# Patient Record
Sex: Male | Born: 1990 | Race: White | Hispanic: No | Marital: Single | Smoking: Current every day smoker
Health system: Southern US, Community
[De-identification: ages and names within clinical notes are randomized; demographics above are authoritative.]

## PROBLEM LIST (undated history)

## (undated) DIAGNOSIS — M5030 Other cervical disc degeneration, unspecified cervical region: Secondary | ICD-10-CM

## (undated) DIAGNOSIS — S0291XA Unspecified fracture of skull, initial encounter for closed fracture: Secondary | ICD-10-CM

## (undated) DIAGNOSIS — J942 Hemothorax: Secondary | ICD-10-CM

---
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
## 2011-09-07 ENCOUNTER — Emergency Department (HOSPITAL_COMMUNITY)
Admission: EM | Admit: 2011-09-07 | Discharge: 2011-09-08 | Disposition: A | Discharge status: Home / Self Care | Payer: Self-pay | Attending: Emergency Medicine | Admitting: Emergency Medicine

## 2011-09-07 ENCOUNTER — Emergency Department (HOSPITAL_COMMUNITY): Payer: Self-pay

## 2011-09-07 ENCOUNTER — Encounter (HOSPITAL_COMMUNITY): Payer: Self-pay | Admitting: *Deleted

## 2011-09-07 DIAGNOSIS — R51 Headache: Secondary | ICD-10-CM | POA: Insufficient documentation

## 2011-09-07 DIAGNOSIS — S0101XA Laceration without foreign body of scalp, initial encounter: Secondary | ICD-10-CM

## 2011-09-07 DIAGNOSIS — S0100XA Unspecified open wound of scalp, initial encounter: Secondary | ICD-10-CM | POA: Insufficient documentation

## 2011-09-07 DIAGNOSIS — M542 Cervicalgia: Secondary | ICD-10-CM | POA: Insufficient documentation

## 2011-09-07 DIAGNOSIS — M25569 Pain in unspecified knee: Secondary | ICD-10-CM | POA: Insufficient documentation

## 2011-09-07 DIAGNOSIS — IMO0001 Reserved for inherently not codable concepts without codable children: Secondary | ICD-10-CM | POA: Insufficient documentation

## 2011-09-07 HISTORY — DX: Unspecified fracture of skull, initial encounter for closed fracture: S02.91XA

## 2011-09-07 MED ORDER — LIDOCAINE HCL (PF) 1 % IJ SOLN
5.0000 mL | Freq: Once | INTRAMUSCULAR | Status: AC
  Administered 2011-09-08: 5 mL via INTRADERMAL
  Filled 2011-09-07: qty 5

## 2011-09-07 NOTE — ED Notes (Signed)
Patient complains of left knee pain and headache
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
## 2011-09-07 NOTE — ED Notes (Signed)
Smells of etoh

## 2011-09-08 MED ORDER — METHOCARBAMOL 500 MG PO TABS
1000.0000 mg | ORAL_TABLET | Freq: Once | ORAL | Status: AC
  Administered 2011-09-08: 1000 mg via ORAL
  Filled 2011-09-08: qty 2

## 2011-09-08 MED ORDER — METHOCARBAMOL 500 MG PO TABS: ORAL_TABLET | ORAL | Status: DC
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
## 2011-09-10 NOTE — ED Provider Notes (Signed)
Medical screening examination/treatment/procedure(s) were performed by non-physician practitioner and as supervising physician I was immediately available for consultation/collaboration.   Kalea Perine L Hisayo Delossantos, MD 09/10/11 2249 

## 2011-09-13 ENCOUNTER — Encounter (HOSPITAL_COMMUNITY): Payer: Self-pay | Admitting: Emergency Medicine

## 2011-09-13 ENCOUNTER — Emergency Department (HOSPITAL_COMMUNITY)
Admission: EM | Admit: 2011-09-13 | Discharge: 2011-09-13 | Disposition: A | Discharge status: Home / Self Care | Payer: Self-pay | Attending: Emergency Medicine | Admitting: Emergency Medicine

## 2011-09-13 DIAGNOSIS — Z4802 Encounter for removal of sutures: Secondary | ICD-10-CM | POA: Insufficient documentation

## 2011-09-13 NOTE — Discharge Instructions (Signed)
Staple Removal  Care After  The staples used to close your skin have been removed. The wound needs continued care so it can heal completely and without problems. The care described here will need to be done for another 5-10 days unless your caregiver advises otherwise.   HOME CARE INSTRUCTIONS    Keep wound site dry and clean.   If skin adhesive strips were applied after the staples were removed, they will begin to peel off in a few days. If they remain after fourteen days, they may be peeled off and discarded.   If you still have a dressing, change it at least once a day or as instructed by your caregiver. If the bandage sticks, soak it off with warm water. Pat dry with a clean towel. Look for signs of infection (see below).   Reapply cream or ointment according to your caregiver's instruction. This will help prevent infection and keep the bandage from sticking. Use of a non-stick material over the wound and under the dressing or wrap will also help keep the bandage from sticking.   If the bandage becomes wet, dirty or develops a foul smell, change it as soon as possible.   New scars become sunburned easily. Use sunscreens with protection factor (SPF) of at least 15 when out in the sun.   Only take over-the-counter or prescription medicines for pain, discomfort or fever as directed by your caregiver.  SEEK IMMEDIATE MEDICAL CARE IF:    There is redness, swelling or increasing pain in the wound.   Pus is coming from the wound.   An unexplained oral temperature above 102 F (38.9 C) develops.   You notice a foul smell coming from the wound or dressing.   There is a breaking open of the suture line (edges not staying together) of the wound edges after staples have been removed.  Document Released: 05/10/2008 Document Revised: 05/17/2011 Document Reviewed: 05/10/2008  [employer] Patient Information 2012 [employer].
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
## 2011-09-21 NOTE — ED Provider Notes (Signed)
Medical screening examination/treatment/procedure(s) were performed by non-physician practitioner and as supervising physician I was immediately available for consultation/collaboration.   Demetries Coia L Sierra Spargo, MD 09/21/11 1816 

## 2012-08-26 ENCOUNTER — Emergency Department (HOSPITAL_COMMUNITY): Payer: Self-pay

## 2012-08-26 ENCOUNTER — Emergency Department (HOSPITAL_COMMUNITY)
Admission: EM | Admit: 2012-08-26 | Discharge: 2012-08-26 | Disposition: A | Discharge status: Home / Self Care | Payer: Self-pay | Attending: Emergency Medicine | Admitting: Emergency Medicine

## 2012-08-26 ENCOUNTER — Encounter (HOSPITAL_COMMUNITY): Payer: Self-pay

## 2012-08-26 DIAGNOSIS — S46912A Strain of unspecified muscle, fascia and tendon at shoulder and upper arm level, left arm, initial encounter: Secondary | ICD-10-CM

## 2012-08-26 DIAGNOSIS — Y9329 Activity, other involving ice and snow: Secondary | ICD-10-CM | POA: Insufficient documentation

## 2012-08-26 DIAGNOSIS — W108XXA Fall (on) (from) other stairs and steps, initial encounter: Secondary | ICD-10-CM | POA: Insufficient documentation

## 2012-08-26 DIAGNOSIS — S20229A Contusion of unspecified back wall of thorax, initial encounter: Secondary | ICD-10-CM | POA: Insufficient documentation

## 2012-08-26 DIAGNOSIS — W010XXA Fall on same level from slipping, tripping and stumbling without subsequent striking against object, initial encounter: Secondary | ICD-10-CM | POA: Insufficient documentation

## 2012-08-26 DIAGNOSIS — Z8781 Personal history of (healed) traumatic fracture: Secondary | ICD-10-CM | POA: Insufficient documentation

## 2012-08-26 DIAGNOSIS — Y92009 Unspecified place in unspecified non-institutional (private) residence as the place of occurrence of the external cause: Secondary | ICD-10-CM | POA: Insufficient documentation

## 2012-08-26 DIAGNOSIS — F17200 Nicotine dependence, unspecified, uncomplicated: Secondary | ICD-10-CM | POA: Insufficient documentation

## 2012-08-26 DIAGNOSIS — Z7982 Long term (current) use of aspirin: Secondary | ICD-10-CM | POA: Insufficient documentation

## 2012-08-26 DIAGNOSIS — IMO0002 Reserved for concepts with insufficient information to code with codable children: Secondary | ICD-10-CM | POA: Insufficient documentation

## 2012-08-26 DIAGNOSIS — S300XXA Contusion of lower back and pelvis, initial encounter: Secondary | ICD-10-CM

## 2012-08-26 MED ORDER — HYDROCODONE-ACETAMINOPHEN 5-325 MG PO TABS: 1.0000 | ORAL_TABLET | ORAL | Status: DC | PRN

## 2012-08-26 MED ORDER — HYDROCODONE-ACETAMINOPHEN 5-325 MG PO TABS
1.0000 | ORAL_TABLET | Freq: Once | ORAL | Status: AC
  Administered 2012-08-26: 1 via ORAL
  Filled 2012-08-26: qty 1

## 2012-08-26 NOTE — ED Provider Notes (Signed)
History     CSN: [id number]  Arrival date & time 08/26/12  4098   First MD Initiated Contact with Patient 08/26/12 0848      Chief Complaint  Patient presents with  . Back Pain    (Consider location/radiation/quality/duration/timing/severity/associated sxs/prior treatment) HPI Comments: Stanley Rogers is a 22 y.o. Male who slipped on ice on his front porch steps as he was leaving for work.  His feet came out from under him, and he landed against his lower back and slid down 5 steps.  He has persistent sharp pain in his lower back which is worse with movement and attempting to stand straight.  He also has pain along his left upper shoulder but denies hitting the shoulder.  He also denies hitting his head and had no loc.  He has taken no medicines prior to arrival.  There is no radiation of pain and he denies weakness or numbness in his extremities.  Rest improves his pain.     The history is provided by the patient.    Past Medical History  Diagnosis Date  . Skull fracture     History reviewed. No pertinent past surgical history.  No family history on file.  History  Substance Use Topics  . Smoking status: Current Every Day Smoker    Types: Cigarettes  . Smokeless tobacco: Not on file  . Alcohol Use: No      Review of Systems  Constitutional: Negative for fever.  Respiratory: Negative for shortness of breath.   Cardiovascular: Negative for chest pain and leg swelling.  Gastrointestinal: Negative for abdominal pain, constipation and abdominal distention.  Genitourinary: Negative for dysuria, urgency, frequency, flank pain and difficulty urinating.  Musculoskeletal: Positive for back pain and arthralgias. Negative for joint swelling and gait problem.  Skin: Negative for rash.  Neurological: Negative for weakness and numbness.    Allergies  Review of patient's allergies indicates no known allergies.  Home Medications   Current Outpatient Rx  Name  Route   Sig  Dispense  Refill  . aspirin 325 MG tablet   Oral   Take 325 mg by mouth every 6 (six) hours as needed for pain.         Marland Kitchen HYDROcodone-acetaminophen (NORCO/VICODIN) 5-325 MG per tablet   Oral   Take 1 tablet by mouth every 4 (four) hours as needed for pain.   15 tablet   0     BP 124/77  Pulse 70  Temp(Src) 97.9 F (36.6 C) (Oral)  Resp 20  Ht 5\' 8"  (1.727 m)  Wt 168 lb (76.204 kg)  BMI 25.55 kg/m2  SpO2 100%  Physical Exam  Nursing note and vitals reviewed. Constitutional: He appears well-developed and well-nourished.  HENT:  Head: Normocephalic.  Eyes: Conjunctivae are normal.  Neck: Normal range of motion. Neck supple.  Cardiovascular: Normal rate and intact distal pulses.   Pedal pulses normal.  Pulmonary/Chest: Effort normal.  Abdominal: Soft. Bowel sounds are normal. He exhibits no distension and no mass.  Musculoskeletal: Normal range of motion. He exhibits no edema.       Left shoulder: He exhibits tenderness and pain. He exhibits no deformity, normal pulse and normal strength.       Lumbar back: He exhibits bony tenderness. He exhibits no swelling, no edema and no spasm.  TTP across left upper shoulder soft tissue/ trapezius muscle with no deformity, no spasm.  Clavicle nontender and without deformity.    Neurological: He is alert. He has  normal strength. He displays no atrophy and no tremor. No sensory deficit. Gait normal.  Reflex Scores:      Patellar reflexes are 2+ on the right side and 2+ on the left side.      Achilles reflexes are 2+ on the right side and 2+ on the left side. No strength deficit noted in hip and knee flexor and extensor muscle groups.  Ankle flexion and extension intact.  Skin: Skin is warm and dry.  Mild abrasion across lower back midline.  Psychiatric: He has a normal mood and affect.    ED Course  Procedures (including critical care time)  Labs Reviewed - No data to display Dg Lumbar Spine Complete  08/26/2012  *RADIOLOGY  REPORT*  Clinical Data: Fall, low back pain  LUMBAR SPINE - COMPLETE 4+ VIEW  Comparison: None.  Findings: Five lumbar-type vertebral bodies.  Mild straightening of the lumbar spine.  No evidence of fracture or dislocation.  Vertebral body heights and intervertebral disc spaces are maintained.  Visualized bony pelvis appears intact.  IMPRESSION: Normal lumbar spine radiographs.   Original Report Authenticated By: Charline Bills, M.D.    Dg Shoulder Left  08/26/2012  *RADIOLOGY REPORT*  Clinical Data: Fall, left shoulder pain  LEFT SHOULDER - 2+ VIEW  Comparison: None.  Findings: No fracture or dislocation is seen.  The joint spaces are preserved.  The visualized soft tissues are unremarkable.  Visualized left lung is clear.  IMPRESSION: No fracture or dislocation is seen.   Original Report Authenticated By: Charline Bills, M.D.      1. Lumbar contusion, initial encounter   2. Shoulder strain, left, initial encounter       MDM  Patients labs and/or radiological studies were viewed and considered during the medical decision making and disposition process.  Pt was given hydrocodone for pain,  Also encouraged ibuprofen tid (pt has, no script).  Rest,  Ice,  Heat therapy starting on day 3.  Pt utd on tetanus.  Prn f/u.  The patient appears reasonably screened and/or stabilized for discharge and I doubt any other medical condition or other [facility] requiring further screening, evaluation, or treatment in the ED at this time prior to discharge.         Burgess Amor, PA-C 08/26/12 1037

## 2012-08-26 NOTE — ED Provider Notes (Signed)
Medical screening examination/treatment/procedure(s) were performed by non-physician practitioner and as supervising physician I was immediately available for consultation/collaboration.   Haaris Metallo L Mykal Batiz, MD 08/26/12 1536 

## 2012-08-26 NOTE — ED Notes (Signed)
Pt reports falling off back porch today, injured back, denies loc

## 2013-01-21 ENCOUNTER — Encounter (HOSPITAL_COMMUNITY): Payer: Self-pay | Admitting: Emergency Medicine

## 2013-01-21 ENCOUNTER — Emergency Department (HOSPITAL_COMMUNITY): Payer: Self-pay

## 2013-01-21 ENCOUNTER — Emergency Department (HOSPITAL_COMMUNITY)
Admission: EM | Admit: 2013-01-21 | Discharge: 2013-01-21 | Disposition: A | Discharge status: Home / Self Care | Payer: Self-pay | Attending: Emergency Medicine | Admitting: Emergency Medicine

## 2013-01-21 DIAGNOSIS — S139XXA Sprain of joints and ligaments of unspecified parts of neck, initial encounter: Secondary | ICD-10-CM | POA: Insufficient documentation

## 2013-01-21 DIAGNOSIS — Y9289 Other specified places as the place of occurrence of the external cause: Secondary | ICD-10-CM | POA: Insufficient documentation

## 2013-01-21 DIAGNOSIS — Z8781 Personal history of (healed) traumatic fracture: Secondary | ICD-10-CM | POA: Insufficient documentation

## 2013-01-21 DIAGNOSIS — Y9302 Activity, running: Secondary | ICD-10-CM | POA: Insufficient documentation

## 2013-01-21 DIAGNOSIS — S161XXA Strain of muscle, fascia and tendon at neck level, initial encounter: Secondary | ICD-10-CM

## 2013-01-21 DIAGNOSIS — IMO0002 Reserved for concepts with insufficient information to code with codable children: Secondary | ICD-10-CM | POA: Insufficient documentation

## 2013-01-21 DIAGNOSIS — S0990XA Unspecified injury of head, initial encounter: Secondary | ICD-10-CM | POA: Insufficient documentation

## 2013-01-21 DIAGNOSIS — F17200 Nicotine dependence, unspecified, uncomplicated: Secondary | ICD-10-CM | POA: Insufficient documentation

## 2013-01-21 DIAGNOSIS — Z8739 Personal history of other diseases of the musculoskeletal system and connective tissue: Secondary | ICD-10-CM | POA: Insufficient documentation

## 2013-01-21 HISTORY — DX: Other cervical disc degeneration, unspecified cervical region: M50.30

## 2013-01-21 MED ORDER — METHOCARBAMOL 500 MG PO TABS: 1000.0000 mg | ORAL_TABLET | Freq: Four times a day (QID) | ORAL | Status: AC

## 2013-01-21 MED ORDER — HYDROCODONE-ACETAMINOPHEN 5-325 MG PO TABS
1.0000 | ORAL_TABLET | Freq: Once | ORAL | Status: AC
  Administered 2013-01-21: 1 via ORAL
  Filled 2013-01-21: qty 1

## 2013-01-21 MED ORDER — HYDROCODONE-ACETAMINOPHEN 5-325 MG PO TABS: 1.0000 | ORAL_TABLET | ORAL | Status: DC | PRN

## 2013-01-21 NOTE — ED Notes (Signed)
Pt reports last night he hit his head on a ladder that was on top of a truck.  Pt says this am neck started feeling stiff and pt still had headache.  No LOC

## 2013-01-21 NOTE — Progress Notes (Signed)
ED/CM noted pt did not have health insurance, and/or PCP. Patient was given the ED [place] uninsured handout with information for the clinics, food pantries, and the handout for insurance sign-up. Patient expressed appreciation for this.      

## 2013-01-25 NOTE — ED Provider Notes (Signed)
CSN: [id number]     Arrival date & time 01/21/13  1038 History     First MD Initiated Contact with Patient 01/21/13 1047     Chief Complaint  Patient presents with  . Neck Pain   (Consider location/radiation/quality/duration/timing/severity/associated sxs/prior Treatment) HPI Comments: Stanley Rogers is a 22 y.o. Male presenting with persistent headache and neck pain after he slipped while running in the rain to get into his truck,  Hitting his head into a ladder which was hanging from the truck.  He denies loss of consciousness, nausea, dizziness, focal weakness or visual changes since this event.  He has persistent pain and has developed a knot on his posterior scalp. He has worsening pain and stiffness of his neck,  Denies weakness or numbness in his arms or hands.   He has taken tylenol without relief of pain.     The history is provided by the patient and a significant other.    Past Medical History  Diagnosis Date  . Skull fracture   . DDD (degenerative disc disease), cervical    History reviewed. No pertinent past surgical history. No family history on file. History  Substance Use Topics  . Smoking status: Current Every Day Smoker    Types: Cigarettes  . Smokeless tobacco: Not on file  . Alcohol Use: No    Review of Systems  Constitutional: Negative for fever.  HENT: Positive for neck pain. Negative for congestion and sore throat.   Eyes: Negative.   Respiratory: Negative for chest tightness and shortness of breath.   Cardiovascular: Negative for chest pain.  Gastrointestinal: Negative for nausea and abdominal pain.  Genitourinary: Negative.   Musculoskeletal: Negative for joint swelling and arthralgias.  Skin: Negative.  Negative for rash and wound.  Neurological: Positive for headaches. Negative for dizziness, weakness, light-headedness and numbness.  Psychiatric/Behavioral: Negative.     Allergies  Review of patient's allergies indicates no known  allergies.  Home Medications   Current Outpatient Rx  Name  Route  Sig  Dispense  Refill  . acetaminophen (TYLENOL) 500 MG tablet   Oral   Take 1,000 mg by mouth every 6 (six) hours as needed for pain.         Marland Kitchen HYDROcodone-acetaminophen (NORCO/VICODIN) 5-325 MG per tablet   Oral   Take 1 tablet by mouth every 4 (four) hours as needed for pain.   15 tablet   0   . methocarbamol (ROBAXIN) 500 MG tablet   Oral   Take 2 tablets (1,000 mg total) by mouth 4 (four) times daily.   40 tablet   0    BP 142/83  Pulse 73  Temp(Src) 98.1 F (36.7 C) (Oral)  Resp 20  Ht 5\' 8"  (1.727 m)  Wt 161 lb (73.029 kg)  BMI 24.49 kg/m2  SpO2 100% Physical Exam  Nursing note and vitals reviewed. Constitutional: He is oriented to person, place, and time. He appears well-developed and well-nourished.  Uncomfortable appearing  HENT:  Head: Normocephalic and atraumatic.  Mouth/Throat: Oropharynx is clear and moist.  Eyes: Conjunctivae and EOM are normal. Pupils are equal, round, and reactive to light.  Neck: Normal range of motion. Neck supple. Spinous process tenderness present. No muscular tenderness present.  Cardiovascular: Normal rate and normal heart sounds.   Pulmonary/Chest: Effort normal.  Abdominal: Soft. There is no tenderness.  Musculoskeletal: Normal range of motion. He exhibits no tenderness.  Lymphadenopathy:    He has no cervical adenopathy.  Neurological: He is alert  and oriented to person, place, and time. He has normal strength. He displays normal reflexes. No cranial nerve deficit or sensory deficit. Coordination and gait normal. GCS eye subscore is 4. GCS verbal subscore is 5. GCS motor subscore is 6.  Normal heel-shin, normal rapid alternating movements. Cranial nerves III-XII intact.  No pronator drift. Equal grip strength.  Skin: Skin is warm and dry. No rash noted.  Psychiatric: He has a normal mood and affect. His speech is normal and behavior is normal. Thought  content normal. Cognition and memory are normal.    ED Course   Procedures (including critical care time)  Labs Reviewed - No data to display No results found. 1. Cervical strain, acute, initial encounter   2. Minor head injury without loss of consciousness, initial encounter     MDM  Patients labs and/or radiological studies were viewed and considered during the medical decision making and disposition process. Pt was prescribed hydrocodone, robaxin,  Encouraged ice therapy x 2 day,  Heat starting day 3.  Recheck for any worsened sx, minor head injury instructions given.    The patient appears reasonably screened and/or stabilized for discharge and I doubt any other medical condition or other [facility] requiring further screening, evaluation, or treatment in the ED at this time prior to discharge.   Burgess Amor, PA-C 01/25/13 [phone]

## 2013-01-27 NOTE — ED Provider Notes (Signed)
Medical screening examination/treatment/procedure(s) were performed by non-physician practitioner and as supervising physician I was immediately available for consultation/collaboration.   Laray Anger, DO 01/27/13 1149

## 2013-02-20 ENCOUNTER — Emergency Department (HOSPITAL_COMMUNITY)
Admission: EM | Admit: 2013-02-20 | Discharge: 2013-02-20 | Disposition: A | Discharge status: Home / Self Care | Payer: Self-pay | Attending: Emergency Medicine | Admitting: Emergency Medicine

## 2013-02-20 ENCOUNTER — Encounter (HOSPITAL_COMMUNITY): Payer: Self-pay | Admitting: Emergency Medicine

## 2013-02-20 DIAGNOSIS — Z87828 Personal history of other (healed) physical injury and trauma: Secondary | ICD-10-CM | POA: Insufficient documentation

## 2013-02-20 DIAGNOSIS — F17200 Nicotine dependence, unspecified, uncomplicated: Secondary | ICD-10-CM | POA: Insufficient documentation

## 2013-02-20 DIAGNOSIS — Z8739 Personal history of other diseases of the musculoskeletal system and connective tissue: Secondary | ICD-10-CM | POA: Insufficient documentation

## 2013-02-20 DIAGNOSIS — R454 Irritability and anger: Secondary | ICD-10-CM

## 2013-02-20 DIAGNOSIS — Z79899 Other long term (current) drug therapy: Secondary | ICD-10-CM | POA: Insufficient documentation

## 2013-02-20 DIAGNOSIS — F329 Major depressive disorder, single episode, unspecified: Secondary | ICD-10-CM

## 2013-02-20 DIAGNOSIS — F911 Conduct disorder, childhood-onset type: Secondary | ICD-10-CM | POA: Insufficient documentation

## 2013-02-20 DIAGNOSIS — F3289 Other specified depressive episodes: Secondary | ICD-10-CM | POA: Insufficient documentation

## 2013-02-20 DIAGNOSIS — F1010 Alcohol abuse, uncomplicated: Secondary | ICD-10-CM | POA: Insufficient documentation

## 2013-02-20 LAB — RAPID URINE DRUG SCREEN, HOSP PERFORMED
Amphetamines: POSITIVE — AB
Benzodiazepines: NOT DETECTED
Opiates: POSITIVE — AB
Tetrahydrocannabinol: NOT DETECTED

## 2013-02-20 LAB — COMPREHENSIVE METABOLIC PANEL
ALT: 20 U/L (ref 0–53)
AST: 18 U/L (ref 0–37)
Albumin: 4 g/dL (ref 3.5–5.2)
CO2: 25 mEq/L (ref 19–32)
Calcium: 9.2 mg/dL (ref 8.4–10.5)
Chloride: 103 mEq/L (ref 96–112)
Creatinine, Ser: 0.77 mg/dL (ref 0.50–1.35)
GFR calc non Af Amer: >90 mL/min (ref >90)
Sodium: 138 mEq/L (ref 135–145)

## 2013-02-20 LAB — CBC
MCH: 31.8 pg (ref 26.0–34.0)
MCV: 89.3 fL (ref 78.0–100.0)
Platelets: 200 10*3/uL (ref 150–400)
RBC: 5.22 MIL/uL (ref 4.22–5.81)
RDW: 13 % (ref 11.5–15.5)
WBC: 11.8 10*3/uL — ABNORMAL HIGH (ref 4.0–10.5)

## 2013-02-20 LAB — SALICYLATE LEVEL: Salicylate Lvl: <2 mg/dL — ABNORMAL LOW (ref 2.8–20.0)

## 2013-02-20 NOTE — ED Notes (Signed)
Patient has one personal belonging bag. 

## 2013-02-20 NOTE — ED Notes (Signed)
Patient has been put in blue scrubs and red socks as well as been wanded by security.  Patient has black jeans a white tee shirt and a pair of black socks and a black baseball cap and a pair of gray high top converse.

## 2013-02-20 NOTE — ED Provider Notes (Signed)
CSN: [id number]     Arrival date & time 02/20/13  1848 History   First MD Initiated Contact with Patient 02/20/13 2055     Chief Complaint  Patient presents with  . Medical Clearance   HPI  History provided by the patient. Patient 22 year old male with a significant PMH presenting with request for help for his depression, anger outbursts, drugs or alcohol abuse. 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 He has had ups and downs with drinking and using narcotic pain medications. He has followed at the [facility] in seeing a psychiatrist and was started on Prozac. He states that this was helping somewhat his depression but was still having problems. His doses was increased the patient is concerned that [facility] not [facility] and has had very difficulty time to contact anyone for further help.  Patient denies any SI or HI. He denies any other complaints.    Past Medical History  Diagnosis Date  . Skull fracture   . DDD (degenerative disc disease), cervical    History reviewed. No pertinent past surgical history. No family history on file. History  Substance Use Topics  . Smoking status: Current Every Day Smoker    Types: Cigarettes  . Smokeless tobacco: Not on file  . Alcohol Use: No    Review of Systems  All other systems reviewed and are negative.    Allergies  Review of patient's allergies indicates no known allergies.  Home Medications   Current Outpatient Rx  Name  Route  Sig  Dispense  Refill  . diphenhydramine-acetaminophen (TYLENOL PM) 25-500 MG TABS   Oral   Take 1 tablet by mouth at bedtime.         Marland Kitchen FLUoxetine (PROZAC) 20 MG capsule   Oral   Take 40 mg by mouth daily.          BP 128/65  Pulse 74  Temp(Src) 98.6 F (37 C) (Oral)  Resp 16  SpO2 99% Physical Exam  Nursing note and vitals reviewed. Constitutional: He is oriented to person, place, and time. He appears  well-developed and well-nourished. No distress.  HENT:  Head: Normocephalic.  Cardiovascular: Normal rate and regular rhythm.   Pulmonary/Chest: Effort normal and breath sounds normal. No respiratory distress. He has no wheezes.  Abdominal: Soft.  Musculoskeletal: Normal range of motion.  Neurological: He is alert and oriented to person, place, and time.  Skin: Skin is warm.  Psychiatric: His behavior is normal. Thought content normal. He exhibits a depressed mood. He expresses no homicidal and no suicidal ideation.    ED Course  Procedures   Results for orders placed during the hospital encounter of 02/20/13  ACETAMINOPHEN LEVEL      Result Value Range   Acetaminophen (Tylenol), Serum <15.0  10 - 30 ug/mL  CBC      Result Value Range   WBC 11.8 (*) 4.0 - 10.5 K/uL   RBC 5.22  4.22 - 5.81 MIL/uL   Hemoglobin 16.6  13.0 - 17.0 g/dL   HCT 40.9  81.1 - 91.4 %   MCV 89.3  78.0 - 100.0 fL   MCH 31.8  26.0 - 34.0 pg   MCHC 35.6  30.0 - 36.0 g/dL   RDW 78.2  95.6 - 21.3 %   Platelets 200  150 - 400 K/uL  COMPREHENSIVE METABOLIC PANEL      Result Value Range   Sodium  138  135 - 145 mEq/L   Potassium 3.8  3.5 - 5.1 mEq/L   Chloride 103  96 - 112 mEq/L   CO2 25  19 - 32 mEq/L   Glucose, Bld 174 (*) 70 - 99 mg/dL   BUN 10  6 - 23 mg/dL   Creatinine, Ser 1.61  0.50 - 1.35 mg/dL   Calcium 9.2  8.4 - 09.6 mg/dL   Total Protein 7.1  6.0 - 8.3 g/dL   Albumin 4.0  3.5 - 5.2 g/dL   AST 18  0 - 37 U/L   ALT 20  0 - 53 U/L   Alkaline Phosphatase 71  39 - 117 U/L   Total Bilirubin 0.7  0.3 - 1.2 mg/dL   GFR calc non Af Amer >90  >90 mL/min   GFR calc Af Amer >90  >90 mL/min  ETHANOL      Result Value Range   Alcohol, Ethyl (B) <11  0 - 11 mg/dL  SALICYLATE LEVEL      Result Value Range   Salicylate Lvl <2.0 (*) 2.8 - 20.0 mg/dL  URINE RAPID DRUG SCREEN (HOSP PERFORMED)      Result Value Range   Opiates POSITIVE (*) NONE DETECTED   Cocaine NONE DETECTED  NONE DETECTED    Benzodiazepines NONE DETECTED  NONE DETECTED   Amphetamines POSITIVE (*) NONE DETECTED   Tetrahydrocannabinol NONE DETECTED  NONE DETECTED   Barbiturates NONE DETECTED  NONE DETECTED        MDM   1. Depression   2. Outbursts of anger      Patient seen and evaluated. Patient well-appearing in no acute distress. Denies SI or HI. He is here voluntarily.  Spoke with Ala Dach and Burt PAC with [facility]. We'll plan to give resource guide. There is also a veterans group that meets on Monday string beans grow locally and will provide information. Patient also advised to use the emergency room at the [facility] if symptoms are worsened.    Angus Seller, PA-C 02/20/13 2107

## 2013-02-20 NOTE — ED Notes (Signed)
Pt brought in by family due to severe depression. Pt states "there is nothing to live for." Pt denies having current HI or SI. Pt states he has intermittent thoughts of harming others, however not at this present time. Pt states he currently abuses prescription pain medication. Pt denies auditory or visual hallucinations.
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
## 2013-02-25 NOTE — ED Provider Notes (Signed)
Medical screening examination/treatment/procedure(s) were performed by non-physician practitioner and as supervising physician I was immediately available for consultation/collaboration.   Claudean Kinds, MD 02/25/13 [phone]

## 2013-06-02 ENCOUNTER — Emergency Department (HOSPITAL_COMMUNITY): Payer: Self-pay

## 2013-06-02 ENCOUNTER — Emergency Department (HOSPITAL_COMMUNITY)
Admission: EM | Admit: 2013-06-02 | Discharge: 2013-06-02 | Disposition: A | Discharge status: Home / Self Care | Payer: Self-pay | Attending: Emergency Medicine | Admitting: Emergency Medicine

## 2013-06-02 ENCOUNTER — Encounter (HOSPITAL_COMMUNITY): Payer: Self-pay | Admitting: Emergency Medicine

## 2013-06-02 DIAGNOSIS — Z23 Encounter for immunization: Secondary | ICD-10-CM | POA: Insufficient documentation

## 2013-06-02 DIAGNOSIS — Z8739 Personal history of other diseases of the musculoskeletal system and connective tissue: Secondary | ICD-10-CM | POA: Insufficient documentation

## 2013-06-02 DIAGNOSIS — F17200 Nicotine dependence, unspecified, uncomplicated: Secondary | ICD-10-CM | POA: Insufficient documentation

## 2013-06-02 DIAGNOSIS — S2020XA Contusion of thorax, unspecified, initial encounter: Secondary | ICD-10-CM | POA: Insufficient documentation

## 2013-06-02 DIAGNOSIS — S51009A Unspecified open wound of unspecified elbow, initial encounter: Secondary | ICD-10-CM | POA: Insufficient documentation

## 2013-06-02 DIAGNOSIS — Z8781 Personal history of (healed) traumatic fracture: Secondary | ICD-10-CM | POA: Insufficient documentation

## 2013-06-02 DIAGNOSIS — S0990XA Unspecified injury of head, initial encounter: Secondary | ICD-10-CM | POA: Insufficient documentation

## 2013-06-02 DIAGNOSIS — IMO0002 Reserved for concepts with insufficient information to code with codable children: Secondary | ICD-10-CM | POA: Insufficient documentation

## 2013-06-02 DIAGNOSIS — S51011A Laceration without foreign body of right elbow, initial encounter: Secondary | ICD-10-CM

## 2013-06-02 DIAGNOSIS — Z79899 Other long term (current) drug therapy: Secondary | ICD-10-CM | POA: Insufficient documentation

## 2013-06-02 MED ORDER — HYDROCODONE-ACETAMINOPHEN 5-325 MG PO TABS: ORAL_TABLET | ORAL | Status: DC

## 2013-06-02 MED ORDER — POVIDONE-IODINE 10 % EX SOLN
CUTANEOUS | Status: AC
  Administered 2013-06-02: 11:00:00
  Filled 2013-06-02: qty 118

## 2013-06-02 MED ORDER — IBUPROFEN 800 MG PO TABS: 800.0000 mg | ORAL_TABLET | Freq: Three times a day (TID) | ORAL | Status: DC

## 2013-06-02 MED ORDER — OXYCODONE-ACETAMINOPHEN 5-325 MG PO TABS
1.0000 | ORAL_TABLET | Freq: Once | ORAL | Status: AC
  Administered 2013-06-02: 1 via ORAL
  Filled 2013-06-02: qty 1

## 2013-06-02 MED ORDER — SULFAMETHOXAZOLE-TRIMETHOPRIM 800-160 MG PO TABS: 1.0000 | ORAL_TABLET | Freq: Two times a day (BID) | ORAL | Status: DC

## 2013-06-02 MED ORDER — TETANUS-DIPHTH-ACELL PERTUSSIS 5-2.5-18.5 LF-MCG/0.5 IM SUSP
0.5000 mL | Freq: Once | INTRAMUSCULAR | Status: AC
  Administered 2013-06-02: 0.5 mL via INTRAMUSCULAR
  Filled 2013-06-02: qty 0.5

## 2013-06-02 MED ORDER — LIDOCAINE HCL (PF) 1 % IJ SOLN
5.0000 mL | Freq: Once | INTRAMUSCULAR | Status: AC
  Administered 2013-06-02: 5 mL via INTRADERMAL
  Filled 2013-06-02: qty 5
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
## 2013-06-02 NOTE — ED Notes (Signed)
Per pa [facility] request, [facility] and [employer] department contacted.  Per [place] c-com [phone], officer will return call.

## 2013-06-03 NOTE — ED Provider Notes (Signed)
Medical screening examination/treatment/procedure(s) were performed by non-physician practitioner and as supervising physician I was immediately available for consultation/collaboration.  EKG Interpretation   None         Zenna Traister L Devaney Segers, MD 06/03/13 1540 

## 2014-03-22 ENCOUNTER — Encounter (HOSPITAL_COMMUNITY): Payer: Self-pay | Admitting: Emergency Medicine

## 2014-03-22 ENCOUNTER — Emergency Department (HOSPITAL_COMMUNITY): Payer: Self-pay

## 2014-03-22 ENCOUNTER — Emergency Department (HOSPITAL_COMMUNITY)
Admission: EM | Admit: 2014-03-22 | Discharge: 2014-03-22 | Disposition: A | Discharge status: Home / Self Care | Payer: Self-pay | Attending: Emergency Medicine | Admitting: Emergency Medicine

## 2014-03-22 DIAGNOSIS — Z8781 Personal history of (healed) traumatic fracture: Secondary | ICD-10-CM | POA: Insufficient documentation

## 2014-03-22 DIAGNOSIS — M5030 Other cervical disc degeneration, unspecified cervical region: Secondary | ICD-10-CM | POA: Insufficient documentation

## 2014-03-22 DIAGNOSIS — M79644 Pain in right finger(s): Secondary | ICD-10-CM | POA: Insufficient documentation

## 2014-03-22 DIAGNOSIS — Z720 Tobacco use: Secondary | ICD-10-CM | POA: Insufficient documentation

## 2014-03-22 MED ORDER — SULFAMETHOXAZOLE-TRIMETHOPRIM 800-160 MG PO TABS: 1.0000 | ORAL_TABLET | Freq: Two times a day (BID) | ORAL | Status: DC

## 2014-03-22 MED ORDER — SULFAMETHOXAZOLE-TMP DS 800-160 MG PO TABS
1.0000 | ORAL_TABLET | Freq: Once | ORAL | Status: AC
  Administered 2014-03-22: 1 via ORAL
  Filled 2014-03-22: qty 1
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
## 2014-03-22 NOTE — ED Notes (Signed)
Patient states he injured his right thumb about 2 months ago and states there are two small bumps around the wound that hurts. Wound is closed, with no drainage noted.

## 2014-03-22 NOTE — ED Provider Notes (Signed)
CSN: [id number]     Arrival date & time 03/22/14  1423 History   First MD Initiated Contact with Patient 03/22/14 1506     Chief Complaint  Patient presents with  . Wound Check     (Consider location/radiation/quality/duration/timing/severity/associated sxs/prior Treatment) HPI... status post right thumb laceration approximately 2 months ago. Patient now complains of 2 small bumps around the laceration site with associated pain. No x-ray taken at original visit. No other complaints. Severity is mild  Past Medical History  Diagnosis Date  . Skull fracture   . DDD (degenerative disc disease), cervical    History reviewed. No pertinent past surgical history. History reviewed. No pertinent family history. History  Substance Use Topics  . Smoking status: Current Every Day Smoker    Types: Cigarettes  . Smokeless tobacco: Not on file  . Alcohol Use: No    Review of Systems  All other systems reviewed and are negative.     Allergies  Review of patient's allergies indicates no known allergies.  Home Medications   Prior to Admission medications   Medication Sig Start Date End Date Taking? Authorizing Provider  acetaminophen (TYLENOL) 500 MG tablet Take 1,000 mg by mouth 2 (two) times daily as needed for headache.   Yes Historical Provider, MD  aspirin 325 MG tablet Take 975 mg by mouth 2 (two) times daily as needed for moderate pain.   Yes Historical Provider, MD  sulfamethoxazole-trimethoprim (SEPTRA DS) 800-160 MG per tablet Take 1 tablet by mouth 2 (two) times daily. 03/22/14   Donnetta HutchingBrian Eufemia Prindle, MD   BP 159/99  Pulse 79  Temp(Src) 98.2 F (36.8 C) (Oral)  Resp 16  Ht 5\' 8"  (1.727 m)  Wt 163 lb (73.936 kg)  BMI 24.79 kg/m2  SpO2 100% Physical Exam  Constitutional: He is oriented to person, place, and time. He appears well-developed and well-nourished.  HENT:  Head: Normocephalic.  Musculoskeletal:  Right thumb:  Healing 1.5 cm wound on medial aspect of proximal phalanx.   3 mm papule noted with central core.  Full range of motion of the digit.  Neurological: He is alert and oriented to person, place, and time.  Skin: Skin is warm.  Psychiatric: He has a normal mood and affect.    ED Course  Procedures (including critical care time) Labs Review Labs Reviewed - No data to display  Imaging Review Dg Finger Thumb Right  03/22/2014   CLINICAL DATA:  23 year old male with pain in the right thumb since a penetrating injury 2 months ago (not otherwise specified at the time of this report). Query foreign body. Initial encounter.  EXAM: RIGHT THUMB 2+V  COMPARISON:  None.  FINDINGS: Bone mineralization is within normal limits. Mild thumb MCP joint space loss and subchondral sclerosis. No acute fracture, dislocation, or osteolysis. The right [facility] appears within normal limits. No radiopaque foreign body identified. No subcutaneous gas identified.  IMPRESSION: Mild degenerative changes at the right thumb MCP joint. No retained radiopaque foreign body or other osseous abnormality identified.   Electronically Signed   By: Augusto GambleLee  Hall M.D.   On: 03/22/2014 15:48     EKG Interpretation None      MDM   Final diagnoses:  Thumb pain, right    X-ray of right thumb negative for foreign body.  Minor incision and drainage performed papule. No pus expressed. Will Rx Septra DS and refer to hand surgeon if not getting better   Donnetta HutchingBrian Allea Kassner, MD 03/22/14 [phone]

## 2014-03-22 NOTE — Discharge Instructions (Signed)
Soak in hot salt water.  Antibiotic.  Referral to hand surgeon if not getting better.  Phone number given

## 2016-09-06 ENCOUNTER — Emergency Department (HOSPITAL_COMMUNITY): Payer: Self-pay

## 2016-09-06 ENCOUNTER — Emergency Department (HOSPITAL_COMMUNITY)
Admission: EM | Admit: 2016-09-06 | Discharge: 2016-09-06 | Disposition: A | Discharge status: Home / Self Care | Payer: Self-pay | Attending: Emergency Medicine | Admitting: Emergency Medicine

## 2016-09-06 ENCOUNTER — Encounter (HOSPITAL_COMMUNITY): Payer: Self-pay

## 2016-09-06 DIAGNOSIS — S0181XA Laceration without foreign body of other part of head, initial encounter: Secondary | ICD-10-CM | POA: Insufficient documentation

## 2016-09-06 DIAGNOSIS — Y92810 Car as the place of occurrence of the external cause: Secondary | ICD-10-CM | POA: Insufficient documentation

## 2016-09-06 DIAGNOSIS — F17210 Nicotine dependence, cigarettes, uncomplicated: Secondary | ICD-10-CM | POA: Insufficient documentation

## 2016-09-06 DIAGNOSIS — Z7982 Long term (current) use of aspirin: Secondary | ICD-10-CM | POA: Insufficient documentation

## 2016-09-06 DIAGNOSIS — Y9389 Activity, other specified: Secondary | ICD-10-CM | POA: Insufficient documentation

## 2016-09-06 DIAGNOSIS — Z79899 Other long term (current) drug therapy: Secondary | ICD-10-CM | POA: Insufficient documentation

## 2016-09-06 DIAGNOSIS — W25XXXA Contact with sharp glass, initial encounter: Secondary | ICD-10-CM | POA: Insufficient documentation

## 2016-09-06 DIAGNOSIS — Y999 Unspecified external cause status: Secondary | ICD-10-CM | POA: Insufficient documentation
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
## 2016-09-06 NOTE — Discharge Instructions (Signed)
Keep wound clean and dry. The glue will fall off on its own. Return to the ED if you develop new or worsening symptoms.
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
## 2017-06-28 ENCOUNTER — Inpatient Hospital Stay (HOSPITAL_COMMUNITY)
Admission: EM | Admit: 2017-06-28 | Discharge: 2017-06-30 | DRG: 200 | Disposition: A | Discharge status: Home / Self Care | Payer: Self-pay | Attending: Surgery | Admitting: Surgery

## 2017-06-28 ENCOUNTER — Emergency Department (HOSPITAL_COMMUNITY): Payer: Self-pay

## 2017-06-28 ENCOUNTER — Encounter (HOSPITAL_COMMUNITY): Payer: Self-pay | Admitting: *Deleted

## 2017-06-28 DIAGNOSIS — S299XXA Unspecified injury of thorax, initial encounter: Secondary | ICD-10-CM

## 2017-06-28 DIAGNOSIS — S41012A Laceration without foreign body of left shoulder, initial encounter: Secondary | ICD-10-CM | POA: Diagnosis present

## 2017-06-28 DIAGNOSIS — S21112A Laceration without foreign body of left front wall of thorax without penetration into thoracic cavity, initial encounter: Secondary | ICD-10-CM | POA: Diagnosis present

## 2017-06-28 DIAGNOSIS — S46222A Laceration of muscle, fascia and tendon of other parts of biceps, left arm, initial encounter: Secondary | ICD-10-CM | POA: Diagnosis present

## 2017-06-28 DIAGNOSIS — Z4682 Encounter for fitting and adjustment of non-vascular catheter: Secondary | ICD-10-CM

## 2017-06-28 DIAGNOSIS — J939 Pneumothorax, unspecified: Secondary | ICD-10-CM

## 2017-06-28 DIAGNOSIS — S41112A Laceration without foreign body of left upper arm, initial encounter: Secondary | ICD-10-CM

## 2017-06-28 DIAGNOSIS — S270XXA Traumatic pneumothorax, initial encounter: Principal | ICD-10-CM

## 2017-06-28 DIAGNOSIS — I959 Hypotension, unspecified: Secondary | ICD-10-CM | POA: Diagnosis present

## 2017-06-28 DIAGNOSIS — Z23 Encounter for immunization: Secondary | ICD-10-CM

## 2017-06-28 LAB — CBC
HEMATOCRIT: 41.3 % (ref 39.0–52.0)
HEMOGLOBIN: 14.1 g/dL (ref 13.0–17.0)
MCH: 30.5 pg (ref 26.0–34.0)
MCHC: 34.1 g/dL (ref 30.0–36.0)
MCV: 89.2 fL (ref 78.0–100.0)
Platelets: 332 10*3/uL (ref 150–400)
RBC: 4.63 MIL/uL (ref 4.22–5.81)
RDW: 13.1 % (ref 11.5–15.5)
WBC: 9.8 10*3/uL (ref 4.0–10.5)

## 2017-06-28 LAB — COMPREHENSIVE METABOLIC PANEL
ALBUMIN: 3.5 g/dL (ref 3.5–5.0)
ALT: 35 U/L (ref 17–63)
ANION GAP: 19 — AB (ref 5–15)
AST: 28 U/L (ref 15–41)
Alkaline Phosphatase: 78 U/L (ref 38–126)
BILIRUBIN TOTAL: 0.5 mg/dL (ref 0.3–1.2)
BUN: 16 mg/dL (ref 6–20)
CALCIUM: 8.6 mg/dL — AB (ref 8.9–10.3)
CO2: 14 mmol/L — ABNORMAL LOW (ref 22–32)
Chloride: 109 mmol/L (ref 101–111)
Creatinine, Ser: 1.24 mg/dL (ref 0.61–1.24)
GLUCOSE: 108 mg/dL — AB (ref 65–99)
POTASSIUM: 3.8 mmol/L (ref 3.5–5.1)
Sodium: 142 mmol/L (ref 135–145)
TOTAL PROTEIN: 6 g/dL — AB (ref 6.5–8.1)

## 2017-06-28 LAB — PREPARE FRESH FROZEN PLASMA
Unit division: 0
Unit division: 0

## 2017-06-28 LAB — BPAM FFP
BLOOD PRODUCT EXPIRATION DATE: 201901252359
Blood Product Expiration Date: 201901212359
ISSUE DATE / TIME: 201901181416
ISSUE DATE / TIME: 201901181416
Unit Type and Rh: 6200
Unit Type and Rh: 6200

## 2017-06-28 LAB — I-STAT CHEM 8, ED
BUN: 15 mg/dL (ref 6–20)
Calcium, Ion: 1.13 mmol/L — ABNORMAL LOW (ref 1.15–1.40)
Chloride: 108 mmol/L (ref 101–111)
Creatinine, Ser: 1.1 mg/dL (ref 0.61–1.24)
GLUCOSE: 104 mg/dL — AB (ref 65–99)
HCT: 40 % (ref 39.0–52.0)
Hemoglobin: 13.6 g/dL (ref 13.0–17.0)
Potassium: 3.8 mmol/L (ref 3.5–5.1)
SODIUM: 143 mmol/L (ref 135–145)
TCO2: 17 mmol/L — AB (ref 22–32)

## 2017-06-28 LAB — ABO/RH: ABO/RH(D): O POS

## 2017-06-28 LAB — URINALYSIS, ROUTINE W REFLEX MICROSCOPIC
Bilirubin Urine: NEGATIVE
Glucose, UA: NEGATIVE mg/dL
Hgb urine dipstick: NEGATIVE
Ketones, ur: NEGATIVE mg/dL
LEUKOCYTES UA: NEGATIVE
NITRITE: NEGATIVE
PROTEIN: NEGATIVE mg/dL
SPECIFIC GRAVITY, URINE: 1.013 (ref 1.005–1.030)
pH: 5 (ref 5.0–8.0)

## 2017-06-28 LAB — I-STAT CG4 LACTIC ACID, ED: LACTIC ACID, VENOUS: 11.93 mmol/L — AB (ref 0.5–1.9)

## 2017-06-28 LAB — PROTIME-INR
INR: 0.94
Prothrombin Time: 12.5 seconds (ref 11.4–15.2)

## 2017-06-28 LAB — ETHANOL

## 2017-06-28 MED ORDER — ONDANSETRON HCL 4 MG/2ML IJ SOLN
INTRAMUSCULAR | Status: AC
  Filled 2017-06-28: qty 2

## 2017-06-28 MED ORDER — FENTANYL CITRATE (PF) 100 MCG/2ML IJ SOLN
INTRAMUSCULAR | Status: AC | PRN
  Administered 2017-06-28 (×2): 50 ug via INTRAVENOUS

## 2017-06-28 MED ORDER — ONDANSETRON HCL 4 MG/2ML IJ SOLN: 4.0000 mg | Freq: Four times a day (QID) | INTRAMUSCULAR | Status: DC | PRN

## 2017-06-28 MED ORDER — FENTANYL CITRATE (PF) 100 MCG/2ML IJ SOLN
INTRAMUSCULAR | Status: AC
  Filled 2017-06-28: qty 2

## 2017-06-28 MED ORDER — ENOXAPARIN SODIUM 40 MG/0.4ML ~~LOC~~ SOLN
40.0000 mg | SUBCUTANEOUS | Status: DC
  Administered 2017-06-28 – 2017-06-29 (×2): 40 mg via SUBCUTANEOUS
  Filled 2017-06-28 (×2): qty 0.4

## 2017-06-28 MED ORDER — SODIUM CHLORIDE 0.9 % IV SOLN: 250.0000 mL | INTRAVENOUS | Status: DC | PRN

## 2017-06-28 MED ORDER — LIDOCAINE-EPINEPHRINE (PF) 2 %-1:200000 IJ SOLN
20.0000 mL | Freq: Once | INTRAMUSCULAR | Status: AC
  Administered 2017-06-28: 20 mL via INTRADERMAL
  Filled 2017-06-28: qty 20

## 2017-06-28 MED ORDER — HYDROMORPHONE HCL 1 MG/ML IJ SOLN
INTRAMUSCULAR | Status: AC
  Filled 2017-06-28: qty 1

## 2017-06-28 MED ORDER — ONDANSETRON 4 MG PO TBDP: 4.0000 mg | ORAL_TABLET | Freq: Four times a day (QID) | ORAL | Status: DC | PRN

## 2017-06-28 MED ORDER — TETANUS-DIPHTH-ACELL PERTUSSIS 5-2.5-18.5 LF-MCG/0.5 IM SUSP
0.5000 mL | Freq: Once | INTRAMUSCULAR | Status: AC
  Administered 2017-06-28: 0.5 mL via INTRAMUSCULAR

## 2017-06-28 MED ORDER — DEXTROSE 5 % IV SOLN
INTRAVENOUS | Status: AC | PRN
  Administered 2017-06-28: 2000 mg via INTRAVENOUS

## 2017-06-28 MED ORDER — PANTOPRAZOLE SODIUM 40 MG IV SOLR: 40.0000 mg | Freq: Every day | INTRAVENOUS | Status: DC

## 2017-06-28 MED ORDER — TETANUS-DIPHTH-ACELL PERTUSSIS 5-2.5-18.5 LF-MCG/0.5 IM SUSP
INTRAMUSCULAR | Status: AC
  Filled 2017-06-28: qty 0.5

## 2017-06-28 MED ORDER — MIDAZOLAM HCL 2 MG/2ML IJ SOLN
INTRAMUSCULAR | Status: AC
  Filled 2017-06-28: qty 2

## 2017-06-28 MED ORDER — MORPHINE SULFATE (PF) 4 MG/ML IV SOLN
4.0000 mg | INTRAVENOUS | Status: DC | PRN
  Administered 2017-06-28 – 2017-06-29 (×5): 4 mg via INTRAVENOUS
  Filled 2017-06-28 (×5): qty 1

## 2017-06-28 MED ORDER — MIDAZOLAM HCL 5 MG/5ML IJ SOLN
INTRAMUSCULAR | Status: AC | PRN
  Administered 2017-06-28: 2 mg via INTRAVENOUS

## 2017-06-28 MED ORDER — SODIUM CHLORIDE 0.9% FLUSH
3.0000 mL | Freq: Two times a day (BID) | INTRAVENOUS | Status: DC
  Administered 2017-06-28 – 2017-06-29 (×3): 3 mL via INTRAVENOUS

## 2017-06-28 MED ORDER — ACETAMINOPHEN 325 MG PO TABS
650.0000 mg | ORAL_TABLET | ORAL | Status: DC | PRN
  Administered 2017-06-28: 650 mg via ORAL
  Filled 2017-06-28: qty 2

## 2017-06-28 MED ORDER — SODIUM CHLORIDE 0.9 % IV SOLN
INTRAVENOUS | Status: AC | PRN
  Administered 2017-06-28: 125 mL/h via INTRAVENOUS

## 2017-06-28 MED ORDER — FENTANYL CITRATE (PF) 100 MCG/2ML IJ SOLN
INTRAMUSCULAR | Status: AC | PRN
  Administered 2017-06-28: 50 ug via INTRAVENOUS

## 2017-06-28 MED ORDER — PANTOPRAZOLE SODIUM 40 MG PO TBEC
40.0000 mg | DELAYED_RELEASE_TABLET | Freq: Every day | ORAL | Status: DC
  Administered 2017-06-29 – 2017-06-30 (×2): 40 mg via ORAL
  Filled 2017-06-28 (×2): qty 1

## 2017-06-28 MED ORDER — ONDANSETRON HCL 4 MG/2ML IJ SOLN
4.0000 mg | Freq: Once | INTRAMUSCULAR | Status: AC
  Administered 2017-06-28: 4 mg via INTRAVENOUS

## 2017-06-28 MED ORDER — DOCUSATE SODIUM 100 MG PO CAPS
100.0000 mg | ORAL_CAPSULE | Freq: Every day | ORAL | Status: DC
  Administered 2017-06-29 – 2017-06-30 (×2): 100 mg via ORAL
  Filled 2017-06-28 (×2): qty 1

## 2017-06-28 MED ORDER — SODIUM CHLORIDE 0.9% FLUSH: 3.0000 mL | INTRAVENOUS | Status: DC | PRN

## 2017-06-28 MED ORDER — HYDRALAZINE HCL 20 MG/ML IJ SOLN: 10.0000 mg | INTRAMUSCULAR | Status: DC | PRN

## 2017-06-28 MED ORDER — OXYCODONE HCL 5 MG PO TABS
5.0000 mg | ORAL_TABLET | ORAL | Status: DC | PRN
  Administered 2017-06-28 – 2017-06-29 (×2): 10 mg via ORAL
  Administered 2017-06-29: 5 mg via ORAL
  Administered 2017-06-29 – 2017-06-30 (×2): 10 mg via ORAL
  Filled 2017-06-28 (×6): qty 2

## 2017-06-28 MED ORDER — CEFAZOLIN SODIUM-DEXTROSE 2-4 GM/100ML-% IV SOLN
INTRAVENOUS | Status: AC
  Filled 2017-06-28: qty 100

## 2017-06-28 MED ORDER — FLUTICASONE PROPIONATE 50 MCG/ACT NA SUSP
1.0000 | Freq: Every day | NASAL | Status: DC
  Administered 2017-06-29 (×2): 1 via NASAL
  Filled 2017-06-28: qty 16

## 2017-06-28 NOTE — ED Notes (Signed)
Attempted report 

## 2017-06-28 NOTE — Procedures (Signed)
  Stanley BottsChristopher Rogers  [id number] 06/28/2017  3:35 PM  PROCEDURE NOTE:   Procedure: Left Chest Tube Insertion Indication: Left PTX  Providers: Dr. Jimmye NormanJames Wyatt, MD, [name], PA-S  Details: Because of the presence of a Left pneumothorax noted by chest xray without hemodynamic compromise, a chest tube was inserted.  Informed Consent was obtained. Prior to beginning the procedure a "time out" was done to assure the correct patient, procedure, and side were identified.  The insertion site and surrounding skin were prepped with povidone iodone and sterile drapes were applied.  After infusing a small amount of lidocaine subcutaneously, an 18 gauge needle was used to enter the pleural space. A wire was threaded over the needle, and a skin incision was made along the guide wire. A dilator was inserted over the wire and then a pig tail catheter was inserted into the pleural space and secured using a silk suture that also closed the remaining incision.  The chest tube was connected to a drainage system and set to wall suction.  An occlusive dressing was applied over the insertion site.  The patient tolerated the procedure well with a small 1 cm alaceration lateral to the chest tube placement site. .  A follow-up chest xray was obtained to assess tube position and resolution of the pneumothorax.   Procedure: Laceration Repair Indication: Left Shoulder Laceration   Providers: Dr. Jimmye NormanJames Wyatt, MD, [name], PA-S  Details: A 5 cm V-Shaped laceration was identified to the left shoulder. The laceration was irrigated with NS and the cleaned with betadine. 1% lidocaine with epinephrine was then used to to provide pain relief. The wound was then irrigated again with copious amounts of NS and explored. 7 staples were then used to close the wound. The patient tolerated the procedure well without complication.     Procedure: Laceration Repair Indication: Left Proximal Bicep  Providers: Dr. Jimmye NormanJames Wyatt,  MD, [name], PA-S  Details: A 3 cm linear laceration was identified to the left bicep. The laceration was irrigated with NS and the cleaned with betadine. 1% lidocaine with epinephrine was then used to to provide pain relief. The wound was then irrigated again with copious amounts of NS and explored. 5 staples were then used to close the wound. The patient tolerated the procedure well without complication.     Procedure: Laceration Repair Indication: Left Lateral Chest Wall  Providers: Dr. Jimmye NormanJames Wyatt, MD, [name], PA-S  Details: A 3 cm linearlaceration was identified to the left lateral chest wall. The laceration was irrigated with NS and the cleaned with betadine. 1% lidocaine with epinephrine was then used to to provide pain relief. The wound was then irrigated again with copious amounts of NS and explored. 3 staples were then used to close the wound. The patient tolerated the procedure well without complication.     Signed:  [name], PA-S [facility] 06/28/2017, 3:47 PM Pager: [phone] Consults: [phone] Mon-Fri 7:00 am-4:30 pm Sat-Sun 7:00 am-11:30 am

## 2017-06-28 NOTE — Progress Notes (Signed)
Patient requested call to his fiancee at number posted.  Message box full and not accepting messages.  Told patienjt not able to reach her or leave a message. Phebe CollaDonna S Aldina Porta, Chaplain   06/28/17 1400  Clinical Encounter Type  Visited With Patient  Visit Type Other (Comment) (patient requested call to finacee [phone]  Referral From Care management
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
## 2017-06-28 NOTE — ED Notes (Signed)
Trauma team placing chest tube.  Pt remains alert and oriented.
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
## 2017-06-29 ENCOUNTER — Inpatient Hospital Stay (HOSPITAL_COMMUNITY): Payer: Self-pay

## 2017-06-29 LAB — BPAM RBC
BLOOD PRODUCT EXPIRATION DATE: 201901262359
Blood Product Expiration Date: 201901302359
ISSUE DATE / TIME: 201901181415
ISSUE DATE / TIME: 201901181917
UNIT TYPE AND RH: 9500
UNIT TYPE AND RH: 9500

## 2017-06-29 LAB — CBC
HEMATOCRIT: 37.4 % — AB (ref 39.0–52.0)
Hemoglobin: 12.6 g/dL — ABNORMAL LOW (ref 13.0–17.0)
MCH: 29.6 pg (ref 26.0–34.0)
MCHC: 33.7 g/dL (ref 30.0–36.0)
MCV: 88 fL (ref 78.0–100.0)
PLATELETS: 260 10*3/uL (ref 150–400)
RBC: 4.25 MIL/uL (ref 4.22–5.81)
RDW: 13.3 % (ref 11.5–15.5)
WBC: 12.7 10*3/uL — AB (ref 4.0–10.5)

## 2017-06-29 LAB — BASIC METABOLIC PANEL
ANION GAP: 9 (ref 5–15)
BUN: 11 mg/dL (ref 6–20)
CALCIUM: 8 mg/dL — AB (ref 8.9–10.3)
CO2: 21 mmol/L — AB (ref 22–32)
CREATININE: 0.83 mg/dL (ref 0.61–1.24)
Chloride: 106 mmol/L (ref 101–111)
GLUCOSE: 95 mg/dL (ref 65–99)
Potassium: 3.5 mmol/L (ref 3.5–5.1)
Sodium: 136 mmol/L (ref 135–145)

## 2017-06-29 LAB — TYPE AND SCREEN
ABO/RH(D): O POS
Antibody Screen: NEGATIVE
Unit division: 0
Unit division: 0

## 2017-06-29 LAB — CDS SEROLOGY

## 2017-06-29 LAB — HIV ANTIBODY (ROUTINE TESTING W REFLEX): HIV Screen 4th Generation wRfx: NONREACTIVE

## 2017-06-29 MED ORDER — PNEUMOCOCCAL VAC POLYVALENT 25 MCG/0.5ML IJ INJ
0.5000 mL | INJECTION | INTRAMUSCULAR | Status: AC
Start: 2017-06-30 — End: 2017-06-30
  Administered 2017-06-30: 0.5 mL via INTRAMUSCULAR
  Filled 2017-06-29: qty 0.5

## 2017-06-29 MED ORDER — SALINE SPRAY 0.65 % NA SOLN
1.0000 | NASAL | Status: DC | PRN
  Administered 2017-06-30: 1 via NASAL
  Filled 2017-06-29: qty 44

## 2017-06-29 MED ORDER — MORPHINE SULFATE (PF) 4 MG/ML IV SOLN: 4.0000 mg | INTRAVENOUS | Status: DC | PRN

## 2017-06-29 MED ORDER — NICOTINE 21 MG/24HR TD PT24
21.0000 mg | MEDICATED_PATCH | Freq: Every day | TRANSDERMAL | Status: DC
  Administered 2017-06-29: 21 mg via TRANSDERMAL
  Filled 2017-06-29 (×2): qty 1
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
## 2017-06-30 ENCOUNTER — Inpatient Hospital Stay (HOSPITAL_COMMUNITY): Payer: Self-pay

## 2017-06-30 ENCOUNTER — Telehealth: Payer: Self-pay | Admitting: Surgery

## 2017-06-30 MED ORDER — OXYCODONE HCL 5 MG PO TABS: 5.0000 mg | ORAL_TABLET | ORAL | 0 refills | Status: DC | PRN

## 2017-06-30 NOTE — Discharge Instructions (Signed)
Laceration Care, Adult A laceration is a cut that goes through all of the layers of the skin and into the tissue that is right under the skin. Some lacerations heal on their own. Others need to be closed with stitches (sutures), staples, skin adhesive strips, or skin glue. Proper laceration care minimizes the risk of infection and helps the laceration to heal better. How is this treated? If sutures or staples were used:  Keep the wound clean and dry.  If you were given a bandage (dressing), you should change it at least one time per day or as told by your health care provider. You should also change it if it becomes wet or dirty.  Keep the wound completely dry for the first 24 hours or as told by your health care provider. After that time, you may shower or bathe. However, make sure that the wound is not soaked in water until after the sutures or staples have been removed.  Clean the wound one time each day or as told by your health care provider: ? Wash the wound with soap and water. ? Rinse the wound with water to remove all soap. ? Pat the wound dry with a clean towel. Do not rub the wound.  After cleaning the wound, apply a thin layer of antibiotic ointmentas told by your health care provider. This will help to prevent infection and keep the dressing from sticking to the wound.  Have the sutures or staples removed as told by your health care provider. If skin adhesive strips were used:  Keep the wound clean and dry.  If you were given a bandage (dressing), you should change it at least one time per day or as told by your health care provider. You should also change it if it becomes dirty or wet.  Do not get the skin adhesive strips wet. You may shower or bathe, but be careful to keep the wound dry.  If the wound gets wet, pat it dry with a clean towel. Do not rub the wound.  Skin adhesive strips fall off on their own. You may trim the strips as the wound heals. Do not remove skin  adhesive strips that are still stuck to the wound. They will fall off in time. If skin glue was used:  Try to keep the wound dry, but you may briefly wet it in the shower or bath. Do not soak the wound in water, such as by swimming.  After you have showered or bathed, gently pat the wound dry with a clean towel. Do not rub the wound.  Do not do any activities that will make you sweat heavily until the skin glue has fallen off on its own.  Do not apply liquid, cream, or ointment medicine to the wound while the skin glue is in place. Using those may loosen the film before the wound has healed.  If you were given a bandage (dressing), you should change it at least one time per day or as told by your health care provider. You should also change it if it becomes dirty or wet.  If a dressing is placed over the wound, be careful not to apply tape directly over the skin glue. Doing that may cause the glue to be pulled off before the wound has healed.  Do not pick at the glue. The skin glue usually remains in place for 5-10 days, then it falls off of the skin. General Instructions  Take over-the-counter and prescription medicines only   as told by your health care provider.  If you were prescribed an antibiotic medicine or ointment, take or apply it as told by your doctor. Do not stop using it even if your condition improves.  To help prevent scarring, make sure to cover your wound with sunscreen whenever you are outside after stitches are removed, after adhesive strips are removed, or when glue remains in place and the wound is healed. Make sure to wear a sunscreen of at least 30 SPF.  Do not scratch or pick at the wound.  Keep all follow-up visits as told by your health care provider. This is important.  Check your wound every day for signs of infection. Watch for: ? Redness, swelling, or pain. ? Fluid, blood, or pus.  Raise (elevate) the injured area above the level of your heart while you  are sitting or lying down, if possible. Contact a health care provider if:  You received a tetanus shot and you have swelling, severe pain, redness, or bleeding at the injection site.  You have a fever.  A wound that was closed breaks open.  You notice a bad smell coming from your wound or your dressing.  You notice something coming out of the wound, such as wood or glass.  Your pain is not controlled with medicine.  You have increased redness, swelling, or pain at the site of your wound.  You have fluid, blood, or pus coming from your wound.  You notice a change in the color of your skin near your wound.  You need to change the dressing frequently due to fluid, blood, or pus draining from the wound.  You develop a new rash.  You develop numbness around the wound. Get help right away if:  You develop severe swelling around the wound.  Your pain suddenly increases and is severe.  You develop painful lumps near the wound or on skin that is anywhere on your body.  You have a red streak going away from your wound.  The wound is on your hand or foot and you cannot properly move a finger or toe.  The wound is on your hand or foot and you notice that your fingers or toes look pale or bluish. This information is not intended to replace advice given to you by your health care provider. Make sure you discuss any questions you have with your health care provider. Document Released: 05/28/2005 Document Revised: 10/28/2015 Document Reviewed: 05/24/2014 Elsevier Interactive Patient Education  2018 [employer]  
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
## 2017-06-30 NOTE — Progress Notes (Signed)
1900: Handoff report received from RN. Pt resting comfortably in bed. Discussed plan of care for the shift. Pt amenable to plan.  0000: Pt continues resting comfortably.  0400: Pt continues resting comfortably.  0700: Handoff report given to RN. No acute events overnight.
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
## 2017-07-01 ENCOUNTER — Encounter (HOSPITAL_COMMUNITY): Payer: Self-pay

## 2017-11-08 ENCOUNTER — Encounter (HOSPITAL_COMMUNITY): Payer: Self-pay

## 2017-11-08 ENCOUNTER — Emergency Department (HOSPITAL_COMMUNITY)
Admission: EM | Admit: 2017-11-08 | Discharge: 2017-11-08 | Disposition: A | Discharge status: Home / Self Care | Payer: Self-pay | Attending: Emergency Medicine | Admitting: Emergency Medicine

## 2017-11-08 DIAGNOSIS — T63481A Toxic effect of venom of other arthropod, accidental (unintentional), initial encounter: Secondary | ICD-10-CM | POA: Insufficient documentation

## 2017-11-08 DIAGNOSIS — F17210 Nicotine dependence, cigarettes, uncomplicated: Secondary | ICD-10-CM | POA: Insufficient documentation

## 2017-11-08 DIAGNOSIS — M79605 Pain in left leg: Secondary | ICD-10-CM | POA: Insufficient documentation

## 2017-11-08 DIAGNOSIS — Z7982 Long term (current) use of aspirin: Secondary | ICD-10-CM | POA: Insufficient documentation

## 2017-11-08 DIAGNOSIS — Z79899 Other long term (current) drug therapy: Secondary | ICD-10-CM | POA: Insufficient documentation

## 2017-11-08 HISTORY — DX: Hemothorax: J94.2

## 2017-11-08 MED ORDER — DEXAMETHASONE 4 MG PO TABS: 4.0000 mg | ORAL_TABLET | Freq: Two times a day (BID) | ORAL | 0 refills | Status: DC

## 2017-11-08 MED ORDER — EPINEPHRINE 0.3 MG/0.3ML IJ SOAJ: 0.3000 mg | Freq: Once | INTRAMUSCULAR | 0 refills | Status: AC

## 2017-11-08 MED ORDER — DEXAMETHASONE SODIUM PHOSPHATE 4 MG/ML IJ SOLN
8.0000 mg | Freq: Once | INTRAMUSCULAR | Status: AC
  Administered 2017-11-08: 8 mg via INTRAVENOUS
  Filled 2017-11-08: qty 2

## 2017-11-08 MED ORDER — FAMOTIDINE IN NACL 20-0.9 MG/50ML-% IV SOLN
20.0000 mg | Freq: Once | INTRAVENOUS | Status: AC
  Administered 2017-11-08: 20 mg via INTRAVENOUS
  Filled 2017-11-08: qty 50

## 2017-11-08 MED ORDER — DIPHENHYDRAMINE HCL 50 MG/ML IJ SOLN
25.0000 mg | Freq: Once | INTRAMUSCULAR | Status: AC
  Administered 2017-11-08: 25 mg via INTRAVENOUS
  Filled 2017-11-08: qty 1
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
## 2017-11-08 NOTE — ED Triage Notes (Signed)
Pt reports getting stung by wasp approx 0900 on left leg. Pt reports taking 2 benadryl. States he "went out". Turned red, cloudy vision . No difficulty breathing at this able to talk to complete sentences

## 2017-11-08 NOTE — Discharge Instructions (Addendum)
Please use Benadryl for symptoms of itching, redness, mild swelling or insect sting.  You have been given a prescription for an EpiPen to inject in your thigh and then come immediately to the emergency department if you are having an acute allergic reaction including difficulty with breathing or swallowing, and unusual swelling.  Please use Decadron 2 times daily with food over the next 4 or 5 days.  Please return immediately to the emergency department if any allergic symptoms, problems, or concerns.

## 2017-12-13 ENCOUNTER — Other Ambulatory Visit: Payer: Self-pay

## 2017-12-13 ENCOUNTER — Encounter (HOSPITAL_COMMUNITY): Payer: Self-pay | Admitting: *Deleted

## 2017-12-13 ENCOUNTER — Emergency Department (HOSPITAL_COMMUNITY)
Admission: EM | Admit: 2017-12-13 | Discharge: 2017-12-13 | Disposition: A | Discharge status: Home / Self Care | Payer: Self-pay | Attending: Emergency Medicine | Admitting: Emergency Medicine

## 2017-12-13 DIAGNOSIS — Z79899 Other long term (current) drug therapy: Secondary | ICD-10-CM | POA: Insufficient documentation

## 2017-12-13 DIAGNOSIS — T7840XA Allergy, unspecified, initial encounter: Secondary | ICD-10-CM | POA: Insufficient documentation

## 2017-12-13 DIAGNOSIS — F17210 Nicotine dependence, cigarettes, uncomplicated: Secondary | ICD-10-CM | POA: Insufficient documentation

## 2017-12-13 DIAGNOSIS — T63464A Toxic effect of venom of wasps, undetermined, initial encounter: Secondary | ICD-10-CM | POA: Insufficient documentation

## 2017-12-13 MED ORDER — RANITIDINE HCL 150 MG PO TABS: 150.0000 mg | ORAL_TABLET | Freq: Two times a day (BID) | ORAL | 0 refills | Status: AC

## 2017-12-13 MED ORDER — DEXAMETHASONE 4 MG PO TABS: 4.0000 mg | ORAL_TABLET | Freq: Two times a day (BID) | ORAL | 0 refills | Status: AC

## 2017-12-13 MED ORDER — DIPHENHYDRAMINE HCL 25 MG PO TABS: 25.0000 mg | ORAL_TABLET | Freq: Four times a day (QID) | ORAL | 0 refills | Status: AC | PRN

## 2017-12-13 MED ORDER — LACTATED RINGERS IV BOLUS
1000.0000 mL | Freq: Once | INTRAVENOUS | Status: AC
  Administered 2017-12-13: 1000 mL via INTRAVENOUS

## 2017-12-13 MED ORDER — ONDANSETRON HCL 4 MG/2ML IJ SOLN
4.0000 mg | Freq: Once | INTRAMUSCULAR | Status: AC
  Administered 2017-12-13: 4 mg via INTRAVENOUS
  Filled 2017-12-13: qty 2

## 2017-12-13 MED ORDER — METHYLPREDNISOLONE SODIUM SUCC 125 MG IJ SOLR
125.0000 mg | Freq: Once | INTRAMUSCULAR | Status: AC
  Administered 2017-12-13: 125 mg via INTRAVENOUS
  Filled 2017-12-13: qty 2

## 2017-12-13 MED ORDER — FAMOTIDINE IN NACL 20-0.9 MG/50ML-% IV SOLN
20.0000 mg | Freq: Once | INTRAVENOUS | Status: AC
Start: 2017-12-13 — End: 2017-12-13
  Administered 2017-12-13: 20 mg via INTRAVENOUS
  Filled 2017-12-13: qty 50

## 2017-12-13 MED ORDER — DIPHENHYDRAMINE HCL 50 MG/ML IJ SOLN
25.0000 mg | Freq: Once | INTRAMUSCULAR | Status: AC
  Administered 2017-12-13: 25 mg via INTRAVENOUS
  Filled 2017-12-13: qty 1
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
## 2017-12-13 NOTE — ED Triage Notes (Signed)
Pt with multiple yellow jackets stings that occurred an hour half to two hours ago per pt. Pt denies any sob or tongue swelling but noted rash all over.

## 2017-12-13 NOTE — ED Notes (Signed)
Pt's rash has subsided. States he feels better

## 2017-12-13 NOTE — ED Notes (Signed)
Took benadryl 50mg  10 nmin after getting stung and 30 min later took another 50mg .  Took tylenol as well.
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
